# Patient Record
Sex: Female | Born: 1990 | Hispanic: No | Marital: Single | Smoking: Never smoker
Health system: Southern US, Community
[De-identification: ages and names within clinical notes are randomized; demographics above are authoritative.]

---

## 2014-12-05 ENCOUNTER — Emergency Department (HOSPITAL_COMMUNITY): Payer: Medicaid Other

## 2014-12-05 ENCOUNTER — Emergency Department (HOSPITAL_COMMUNITY)
Admission: EM | Admit: 2014-12-05 | Discharge: 2014-12-05 | Disposition: A | Discharge status: Home / Self Care | Payer: Medicaid Other | Attending: Emergency Medicine | Admitting: Emergency Medicine

## 2014-12-05 ENCOUNTER — Encounter (HOSPITAL_COMMUNITY): Payer: Self-pay | Admitting: Emergency Medicine

## 2014-12-05 DIAGNOSIS — Z79899 Other long term (current) drug therapy: Secondary | ICD-10-CM | POA: Diagnosis not present

## 2014-12-05 DIAGNOSIS — R1031 Right lower quadrant pain: Secondary | ICD-10-CM | POA: Diagnosis present

## 2014-12-05 DIAGNOSIS — Z3202 Encounter for pregnancy test, result negative: Secondary | ICD-10-CM | POA: Diagnosis not present

## 2014-12-05 DIAGNOSIS — N83201 Unspecified ovarian cyst, right side: Secondary | ICD-10-CM

## 2014-12-05 LAB — CBC WITH DIFFERENTIAL/PLATELET
Basophils Absolute: 0 10*3/uL (ref 0.0–0.1)
Basophils Relative: 1 %
EOS ABS: 0.1 10*3/uL (ref 0.0–0.7)
Eosinophils Relative: 4 %
HEMATOCRIT: 36.7 % (ref 36.0–46.0)
HEMOGLOBIN: 11.9 g/dL — AB (ref 12.0–15.0)
LYMPHS ABS: 1.7 10*3/uL (ref 0.7–4.0)
Lymphocytes Relative: 50 %
MCH: 29 pg (ref 26.0–34.0)
MCHC: 32.4 g/dL (ref 30.0–36.0)
MCV: 89.5 fL (ref 78.0–100.0)
MONO ABS: 0.4 10*3/uL (ref 0.1–1.0)
MONOS PCT: 13 %
NEUTROS PCT: 32 %
Neutro Abs: 1.1 10*3/uL — ABNORMAL LOW (ref 1.7–7.7)
Platelets: 230 10*3/uL (ref 150–400)
RBC: 4.1 MIL/uL (ref 3.87–5.11)
RDW: 12.5 % (ref 11.5–15.5)
WBC: 3.4 10*3/uL — ABNORMAL LOW (ref 4.0–10.5)

## 2014-12-05 LAB — I-STAT BETA HCG BLOOD, ED (MC, WL, AP ONLY): I-stat hCG, quantitative: <5 m[IU]/mL (ref <5)

## 2014-12-05 LAB — COMPREHENSIVE METABOLIC PANEL
ALK PHOS: 43 U/L (ref 38–126)
ALT: 15 U/L (ref 14–54)
ANION GAP: 5 (ref 5–15)
AST: 20 U/L (ref 15–41)
Albumin: 3.9 g/dL (ref 3.5–5.0)
BILIRUBIN TOTAL: 0.3 mg/dL (ref 0.3–1.2)
BUN: 13 mg/dL (ref 6–20)
CALCIUM: 8.5 mg/dL — AB (ref 8.9–10.3)
CO2: 23 mmol/L (ref 22–32)
Chloride: 110 mmol/L (ref 101–111)
Creatinine, Ser: 0.53 mg/dL (ref 0.44–1.00)
GFR calc non Af Amer: >60 mL/min (ref >60)
Glucose, Bld: 98 mg/dL (ref 65–99)
Potassium: 3.8 mmol/L (ref 3.5–5.1)
SODIUM: 138 mmol/L (ref 135–145)
Total Protein: 6.9 g/dL (ref 6.5–8.1)

## 2014-12-05 LAB — URINALYSIS, ROUTINE W REFLEX MICROSCOPIC
BILIRUBIN URINE: NEGATIVE
Glucose, UA: NEGATIVE mg/dL
HGB URINE DIPSTICK: NEGATIVE
KETONES UR: NEGATIVE mg/dL
Leukocytes, UA: NEGATIVE
Nitrite: NEGATIVE
PROTEIN: NEGATIVE mg/dL
Specific Gravity, Urine: 1.007 (ref 1.005–1.030)
pH: 7 (ref 5.0–8.0)

## 2014-12-05 MED ORDER — ONDANSETRON HCL 4 MG PO TABS: 4.0000 mg | ORAL_TABLET | Freq: Four times a day (QID) | ORAL | Status: AC

## 2014-12-05 MED ORDER — IBUPROFEN 600 MG PO TABS: 600.0000 mg | ORAL_TABLET | Freq: Four times a day (QID) | ORAL | Status: AC | PRN

## 2014-12-05 MED ORDER — MORPHINE SULFATE (PF) 2 MG/ML IV SOLN
2.0000 mg | Freq: Once | INTRAVENOUS | Status: AC
  Administered 2014-12-05: 2 mg via INTRAVENOUS
  Filled 2014-12-05: qty 1

## 2014-12-05 MED ORDER — ONDANSETRON HCL 4 MG/2ML IJ SOLN
4.0000 mg | Freq: Once | INTRAMUSCULAR | Status: AC
  Administered 2014-12-05: 4 mg via INTRAVENOUS
  Filled 2014-12-05: qty 2

## 2014-12-05 MED ORDER — IOHEXOL 300 MG/ML  SOLN
75.0000 mL | Freq: Once | INTRAMUSCULAR | Status: AC | PRN
  Administered 2014-12-05: 75 mL via INTRAVENOUS

## 2014-12-05 NOTE — ED Notes (Signed)
Pt. Stated, I went to [facility] and they sent me here and said I might have appendicitis.. I ve some nausea off and on.  Pain woke me up in the middle of the night.

## 2014-12-05 NOTE — ED Notes (Signed)
Vital signs stable. 

## 2014-12-05 NOTE — ED Provider Notes (Signed)
CSN: [id number]     Arrival date & time 12/05/14  1124 History   First MD Initiated Contact with Patient 12/05/14 1142     Chief Complaint  Patient presents with  . Abdominal Pain  . Nausea    (Consider location/radiation/quality/duration/timing/severity/associated sxs/prior Treatment) Patient is a 24 y.o. female presenting with abdominal pain. The history is provided by the patient. No language interpreter was used.  Abdominal Pain Associated symptoms: nausea   Associated symptoms: no chills, no constipation, no diarrhea, no fever and no vomiting   Ms. Shelley Gonzales is a [age] year old female with no past medical history who states that she was awoken last night by sudden onset of stabbing intermittent right lower quadrant abdominal pain. [facility] went to [facility] who recommended that she come to the ED for appendicitis rule out. She says she was nauseous this morning but that it has since resolved. She denies taking anything for pain. She denies being on birth control. Reports that she recently had vaginal discharge and treated it with over the counter medication, Diflucan. She denies any fever, chills, chest pain, shortness of breath, vomiting, diarrhea, constipation, dysuria, hematuria, urinary frequency, vaginal discharge or bleeding. Her last menstrual period was 2 weeks ago. She denies any recent alcohol use. She states she does not want any pain meds. No prior abdominal surgeries.  History reviewed. No pertinent past medical history. History reviewed. No pertinent past surgical history. No family history on file. Social History  Substance Use Topics  . Smoking status: Never Smoker   . Smokeless tobacco: None  . Alcohol Use: Yes   OB History    No data available     Review of Systems  Constitutional: Negative for fever and chills.  Gastrointestinal: Positive for nausea and abdominal pain. Negative for vomiting, diarrhea, constipation and blood in stool.  All other systems  reviewed and are negative.     Allergies  Review of patient's allergies indicates no known allergies.  Home Medications   Prior to Admission medications   Medication Sig Start Date End Date Taking? Authorizing Provider  omeprazole (PRILOSEC) 20 MG capsule Take 20 mg by mouth daily.   Yes Historical Provider, MD  ibuprofen (ADVIL,MOTRIN) 600 MG tablet Take 1 tablet (600 mg total) by mouth every 6 (six) hours as needed. 12/05/14   Zayon Trulson Patel-Mills, PA-C  ondansetron (ZOFRAN) 4 MG tablet Take 1 tablet (4 mg total) by mouth every 6 (six) hours. 12/05/14   Flonnie Wierman Patel-Mills, PA-C   BP 104/63 mmHg  Pulse 82  Temp(Src) 98.9 F (37.2 C) (Oral)  Resp 16  Ht 5\' 4"  (1.626 m)  Wt 108 lb 5 oz (49.13 kg)  BMI 18.58 kg/m2  SpO2 100%  LMP 11/21/2014 Physical Exam  Constitutional: She is oriented to person, place, and time. She appears well-developed and well-nourished.  HENT:  Head: Normocephalic and atraumatic.  Eyes: Conjunctivae are normal.  Neck: Normal range of motion. Neck supple.  Cardiovascular: Normal rate, regular rhythm and normal heart sounds.   Pulmonary/Chest: Effort normal and breath sounds normal. No respiratory distress. She has no wheezes. She has no rales.  Abdominal: Soft. Normal appearance. She exhibits no distension. There is tenderness in the right lower quadrant. There is no rebound, no guarding and no CVA tenderness.    Right lower quadrant abdominal tenderness. No guarding or rebound. No abdominal distention. Abdomen appears normal. Scarring from navel piercing. No CVA tenderness. Negative Rovsing's, obturator, and psoas sign. Positive heel tap.  Musculoskeletal: Normal range  of motion.  Neurological: She is alert and oriented to person, place, and time.  Skin: Skin is warm and dry.  Psychiatric: She has a normal mood and affect.  Nursing note and vitals reviewed.   ED Course  Procedures (including critical care time) Labs Review Labs Reviewed  CBC WITH  DIFFERENTIAL/PLATELET - Abnormal; Notable for the following:    WBC 3.4 (*)    Hemoglobin 11.9 (*)    Neutro Abs 1.1 (*)    All other components within normal limits  COMPREHENSIVE METABOLIC PANEL - Abnormal; Notable for the following:    Calcium 8.5 (*)    All other components within normal limits  URINALYSIS, ROUTINE W REFLEX MICROSCOPIC (NOT AT [facility]) - Abnormal; Notable for the following:    APPearance CLOUDY (*)    All other components within normal limits  I-STAT BETA HCG BLOOD, ED ([facility], [facility], [facility] ONLY)    Imaging Review Ct Abdomen Pelvis W Contrast  12/05/2014  CLINICAL DATA:  Right lower quadrant pain and nausea. EXAM: CT ABDOMEN AND PELVIS WITH CONTRAST TECHNIQUE: Multidetector CT imaging of the abdomen and pelvis was performed using the standard protocol following bolus administration of intravenous contrast. CONTRAST:  75mL OMNIPAQUE IOHEXOL 300 MG/ML  SOLN COMPARISON:  None. FINDINGS: Lower chest:  Normal. Hepatobiliary: Normal. Pancreas: Normal. Spleen: Normal. Adrenals/Urinary Tract: Normal. Stomach/Bowel: Normal including the terminal ileum and appendix. Vascular/Lymphatic: Normal. Reproductive: There is a complex mixed solid and cystic mass in the right adnexum measuring 5.1 x 3.7 x 3.5 cm. Left ovary and uterus are normal. Tiny amount of free fluid in the pelvic cul-de-sac, normal for a female of this age. Other: No free air. Musculoskeletal: Normal. IMPRESSION: Complex 5.1 cm right adnexal mass, probably arising from the right ovary. This could represent an endometrioma or dermoid cyst. The appearance is not typical for a hemorrhagic cyst. Pelvic ultrasound may further characterize the lesion. Otherwise, benign appearing abdomen and pelvis. Electronically Signed   By: Francene Boyers M.D.   On: 12/05/2014 15:34   I have personally reviewed and evaluated these images and lab results as part of my medical decision-making.   EKG Interpretation None      MDM   Final diagnoses:    Cyst of right ovary  Patient presents for right lower quadrant abdominal pain from urgent care for appendicitis rule out. She is well-appearing and vitals are stable. Upon arrival patient refused pain medication or nausea medication. She stated that she was not in much pain. Her labs are not concerning. She is not pregnant and does not have a UTI. CT abdomen is negative for appendicitis but does show a complex 5.1 cm right adnexal mass on the right ovary. This could be an edometrioma versus a dermoid cyst. Because of her mild pain and no vomiting in the ED, I discussed this patient with Dr. Jolayne Panther with GYN to see if the patient could go home with close outpatient follow up. She agreed. She was given a prescription for ibuprofen and Zofran to go home with. I discussed return precautions with the patient as well as follow-up and gave her the office number to make an appointment in 2 days.    Medications  morphine 2 MG/ML injection 2 mg (2 mg Intravenous Given 12/05/14 1307)  ondansetron (ZOFRAN) injection 4 mg (4 mg Intravenous Given 12/05/14 1307)  iohexol (OMNIPAQUE) 300 MG/ML solution 75 mL (75 mLs Intravenous Contrast Given 12/05/14 1501)      Catha Gosselin, PA-C 12/06/14 1013  Lebron Conners  Donnald Garre, MD 12/16/14 1454

## 2014-12-07 ENCOUNTER — Telehealth: Payer: Self-pay | Admitting: General Practice

## 2014-12-07 NOTE — Telephone Encounter (Signed)
Per Dr Jolayne Pantheronstant, patient was recently seen in ER and needs f/u ultrasound in 2 weeks for right ovarian cyst and f/u appt in our office. [facility]. Called patient and she states she is going to f/u with [facility] and has an appt there today. Patient had no questions

## 2014-12-08 ENCOUNTER — Telehealth: Payer: Self-pay | Admitting: *Deleted

## 2014-12-08 NOTE — Telephone Encounter (Signed)
Opened in error

## 2016-07-04 IMAGING — CT CT ABD-PELV W/ CM
2 of 4 series · 16 of 46 positions shown, 18 images · IV contrast (omnipaque)
Comparison: None.

CLINICAL DATA: Right lower quadrant pain and nausea.

EXAM:
CT ABDOMEN AND PELVIS WITH CONTRAST
TECHNIQUE: Multidetector CT imaging of the abdomen and pelvis was performed
using the standard protocol following bolus administration of
intravenous contrast.
CONTRAST:  75mL OMNIPAQUE IOHEXOL 300 MG/ML  SOLN

[Series 2: abd/ pelvis 5.0 i30f 1 · axial · 0.57mm/px · z∈[-707,-297]mm · 13 of 90 slices shown, 15 images]
[im 4/90  soft-tissue]
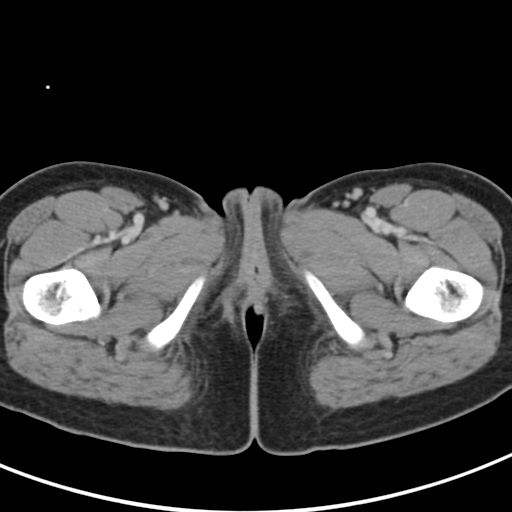
[im 4/90  bone]
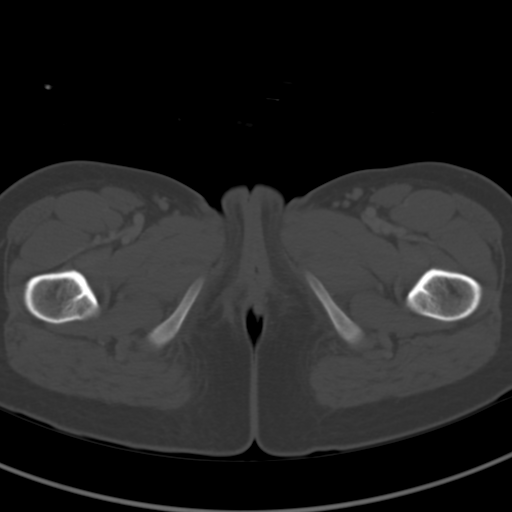
[im 12/90  soft-tissue]
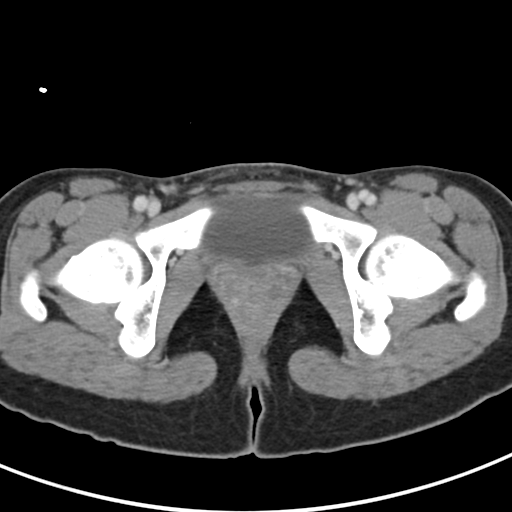
[im 19/90  soft-tissue]
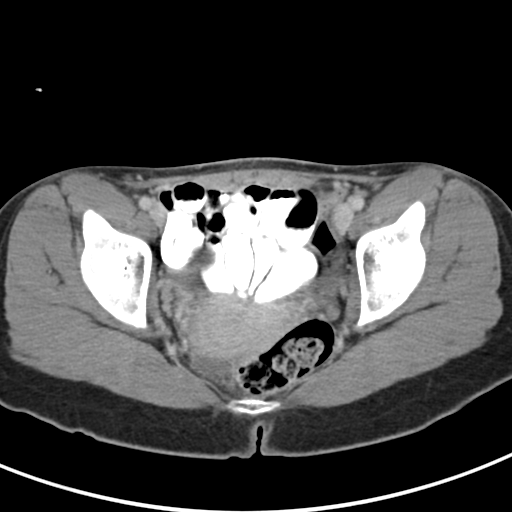
[im 26/90  soft-tissue]
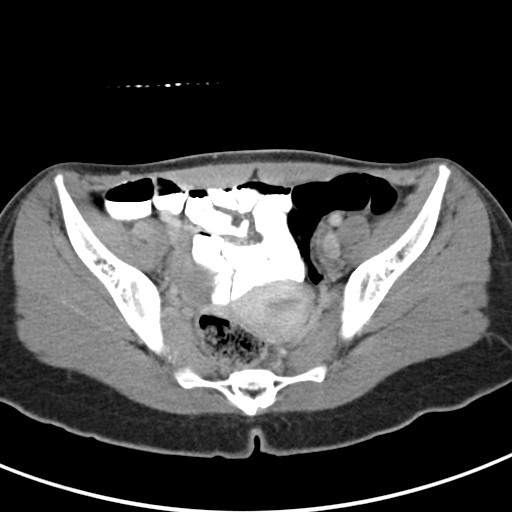
[im 30/90  soft-tissue]
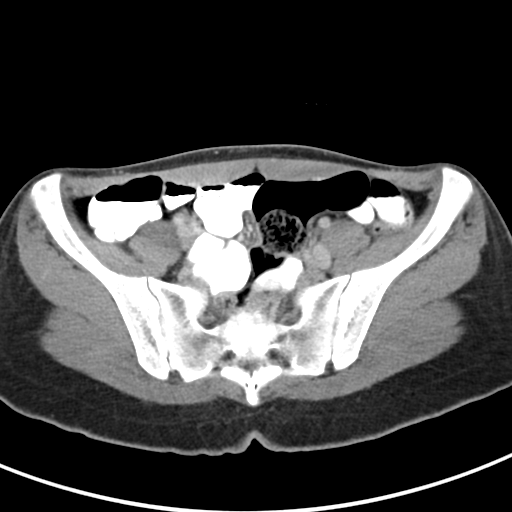
[im 38/90  soft-tissue]
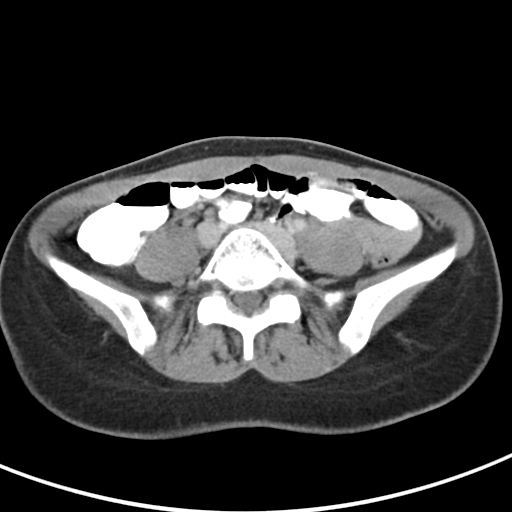
[im 45/90  soft-tissue]
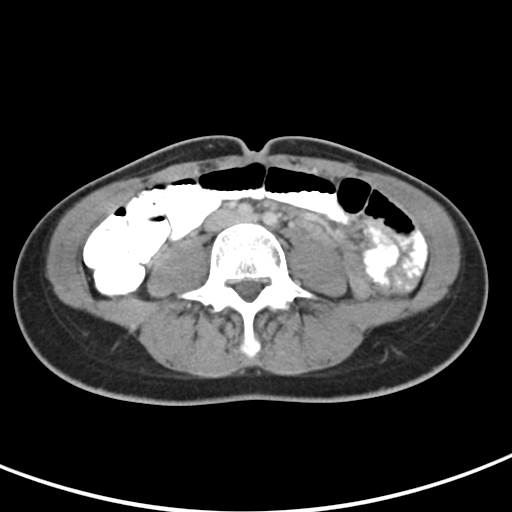
[im 52/90  soft-tissue]
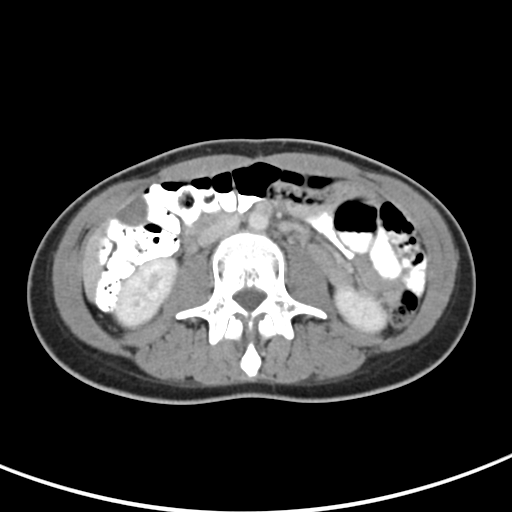
[im 60/90  soft-tissue]
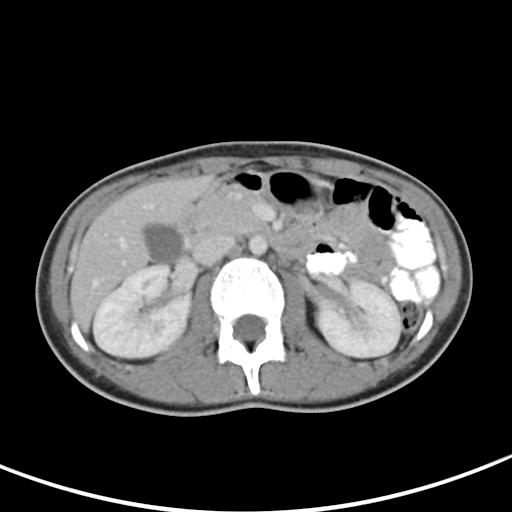
[im 60/90  bone]
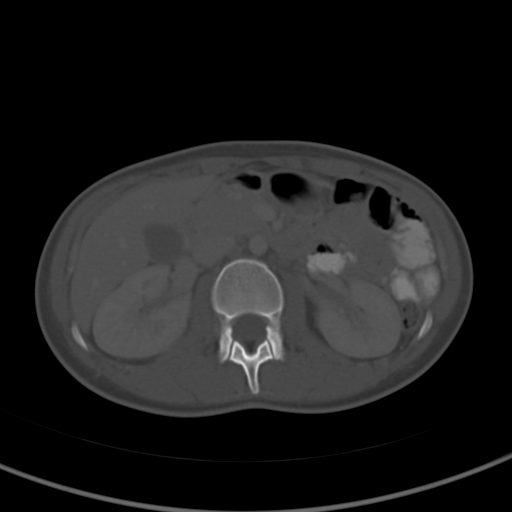
[im 64/90  soft-tissue]
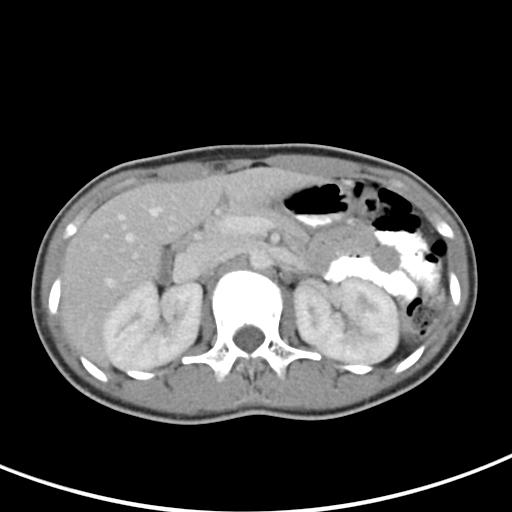
[im 71/90  soft-tissue]
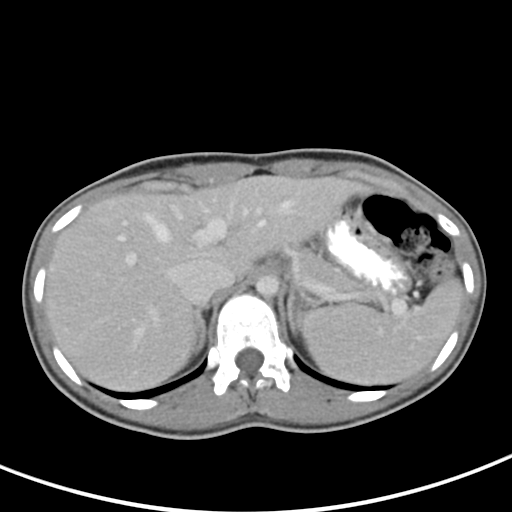
[im 78/90  soft-tissue]
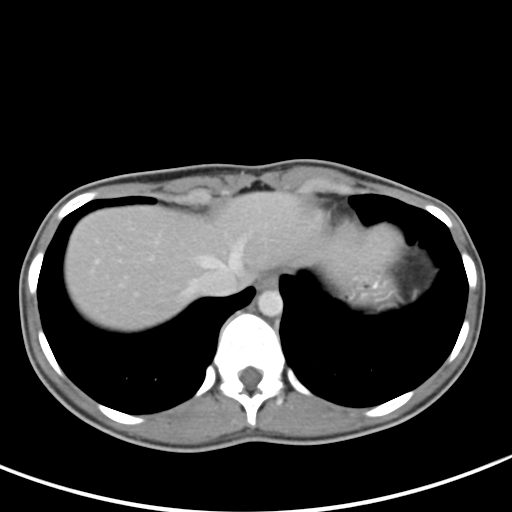
[im 86/90  soft-tissue]
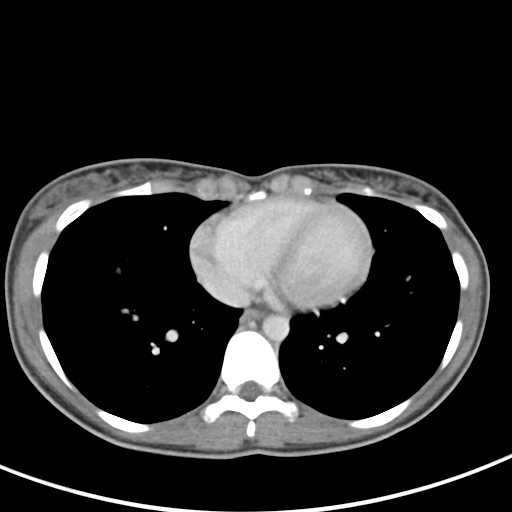

[Series 5: coronal soft tissue · coronal · 0.60mm/px · 3 of 56 slices shown]
[im 19/56  soft-tissue]
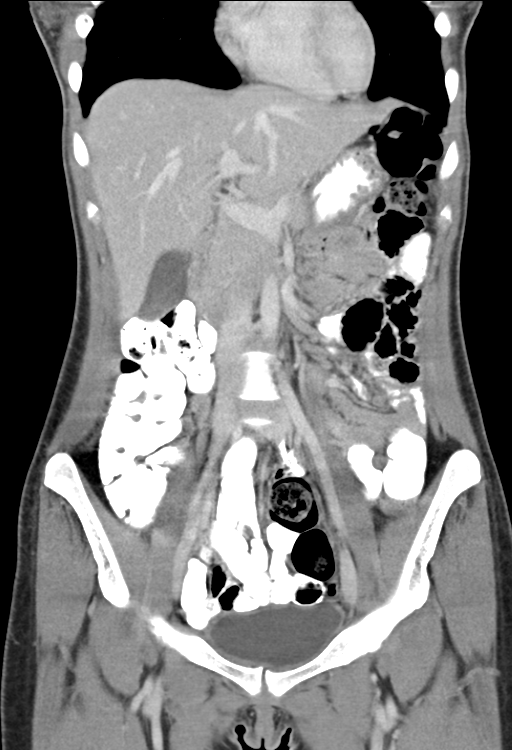
[im 25/56  soft-tissue]
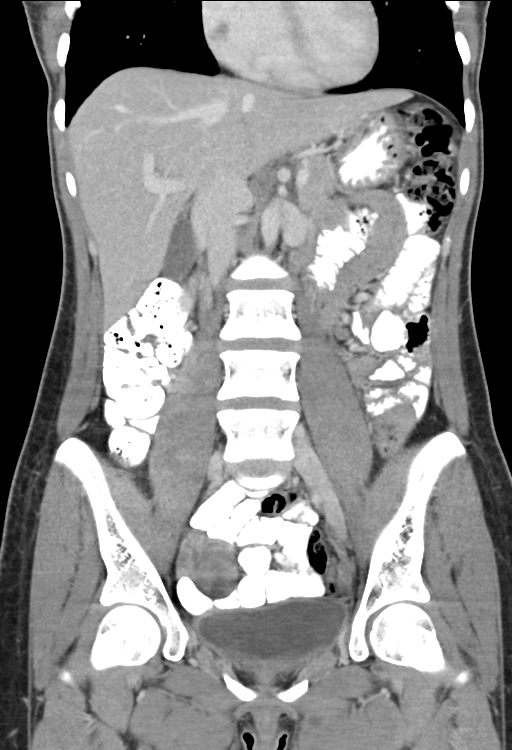
[im 31/56  soft-tissue]
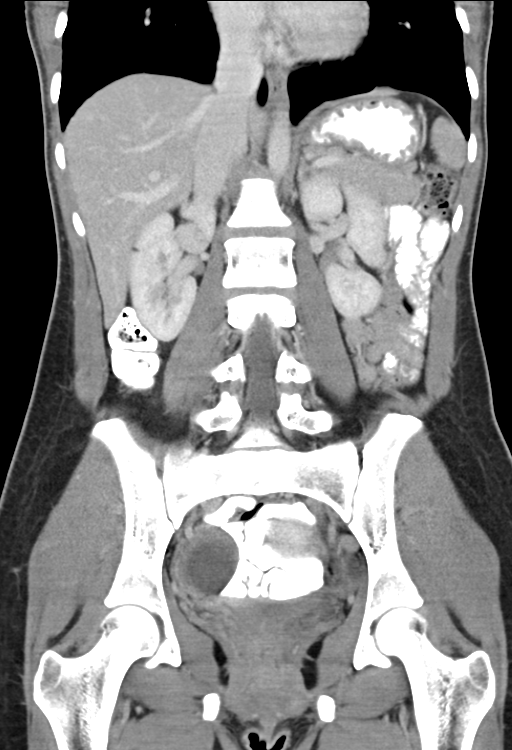

[16 of 46 positions shown; findings below may reference images not displayed]

FINDINGS: Lower chest:  Normal.

Hepatobiliary: Normal.

Pancreas: Normal.

Spleen: Normal.

Adrenals/Urinary Tract: Normal.

Stomach/Bowel: Normal including the terminal ileum and appendix.

Vascular/Lymphatic: Normal.

Reproductive: There is a complex mixed solid and cystic mass in the
right adnexum measuring 5.1 x 3.7 x 3.5 cm. Left ovary and uterus
are normal. Tiny amount of free fluid in the pelvic cul-de-sac,
normal for a female of this age.

Other: No free air.

Musculoskeletal: Normal.
IMPRESSION: Complex 5.1 cm right adnexal mass, probably arising from the right
ovary. This could represent an endometrioma or dermoid cyst. The
appearance is not typical for a hemorrhagic cyst. Pelvic ultrasound
may further characterize the lesion.

Otherwise, benign appearing abdomen and pelvis.
